# Patient Record
Sex: Female | Born: 1960 | Race: White | Hispanic: No | Smoking: Never smoker
Health system: Southern US, Community
[De-identification: ages and names within clinical notes are randomized; demographics above are authoritative.]

## PROBLEM LIST (undated history)

## (undated) DIAGNOSIS — N6310 Unspecified lump in the right breast, unspecified quadrant: Secondary | ICD-10-CM

## (undated) DIAGNOSIS — D649 Anemia, unspecified: Secondary | ICD-10-CM

## (undated) DIAGNOSIS — Z86718 Personal history of other venous thrombosis and embolism: Secondary | ICD-10-CM

## (undated) DIAGNOSIS — T8859XA Other complications of anesthesia, initial encounter: Secondary | ICD-10-CM

## (undated) DIAGNOSIS — I739 Peripheral vascular disease, unspecified: Secondary | ICD-10-CM

## (undated) DIAGNOSIS — I82409 Acute embolism and thrombosis of unspecified deep veins of unspecified lower extremity: Secondary | ICD-10-CM

## (undated) DIAGNOSIS — T4145XA Adverse effect of unspecified anesthetic, initial encounter: Secondary | ICD-10-CM

## (undated) DIAGNOSIS — Z90711 Acquired absence of uterus with remaining cervical stump: Secondary | ICD-10-CM

## (undated) DIAGNOSIS — IMO0002 Reserved for concepts with insufficient information to code with codable children: Secondary | ICD-10-CM

## (undated) HISTORY — PX: ABDOMINAL HYSTERECTOMY: SHX81

## (undated) HISTORY — PX: WISDOM TOOTH EXTRACTION: SHX21

## (undated) HISTORY — DX: Unspecified lump in the right breast, unspecified quadrant: N63.10

## (undated) HISTORY — PX: OVARIAN CYST REMOVAL: SHX89

## (undated) HISTORY — DX: Acute embolism and thrombosis of unspecified deep veins of unspecified lower extremity: I82.409

## (undated) HISTORY — DX: Acquired absence of uterus with remaining cervical stump: Z90.711

## (undated) HISTORY — DX: Personal history of other venous thrombosis and embolism: Z86.718

## (undated) HISTORY — PX: MYOMECTOMY: SHX85

## (undated) HISTORY — DX: Reserved for concepts with insufficient information to code with codable children: IMO0002

---

## 1998-08-06 ENCOUNTER — Encounter: Admission: RE | Admit: 1998-08-06 | Discharge: 1998-08-13 | Payer: Self-pay | Admitting: Family Medicine

## 1998-11-12 ENCOUNTER — Other Ambulatory Visit: Admission: RE | Admit: 1998-11-12 | Discharge: 1998-11-12 | Payer: Self-pay | Admitting: Obstetrics & Gynecology

## 2001-03-06 ENCOUNTER — Ambulatory Visit (HOSPITAL_COMMUNITY): Admission: RE | Admit: 2001-03-06 | Discharge: 2001-03-06 | Payer: Self-pay | Admitting: Obstetrics and Gynecology

## 2001-03-06 ENCOUNTER — Encounter: Payer: Self-pay | Admitting: Obstetrics and Gynecology

## 2001-03-15 ENCOUNTER — Other Ambulatory Visit: Admission: RE | Admit: 2001-03-15 | Discharge: 2001-03-15 | Payer: Self-pay | Admitting: Obstetrics and Gynecology

## 2001-11-07 ENCOUNTER — Encounter: Payer: Self-pay | Admitting: Obstetrics and Gynecology

## 2001-11-07 ENCOUNTER — Encounter: Admission: RE | Admit: 2001-11-07 | Discharge: 2001-11-07 | Payer: Self-pay | Admitting: Obstetrics and Gynecology

## 2001-11-21 ENCOUNTER — Encounter: Payer: Self-pay | Admitting: Obstetrics and Gynecology

## 2001-11-21 ENCOUNTER — Encounter: Admission: RE | Admit: 2001-11-21 | Discharge: 2001-11-21 | Payer: Self-pay | Admitting: Obstetrics and Gynecology

## 2002-04-12 ENCOUNTER — Ambulatory Visit: Admission: RE | Admit: 2002-04-12 | Discharge: 2002-04-12 | Payer: Self-pay | Admitting: Family Medicine

## 2003-03-07 ENCOUNTER — Encounter: Admission: RE | Admit: 2003-03-07 | Discharge: 2003-03-07 | Payer: Self-pay | Admitting: Family Medicine

## 2003-03-18 ENCOUNTER — Other Ambulatory Visit: Admission: RE | Admit: 2003-03-18 | Discharge: 2003-03-18 | Payer: Self-pay | Admitting: Obstetrics and Gynecology

## 2003-06-13 HISTORY — PX: ENDOMETRIAL BIOPSY: SHX622

## 2005-04-15 ENCOUNTER — Other Ambulatory Visit: Admission: RE | Admit: 2005-04-15 | Discharge: 2005-04-15 | Payer: Self-pay | Admitting: Obstetrics and Gynecology

## 2005-05-02 ENCOUNTER — Encounter: Admission: RE | Admit: 2005-05-02 | Discharge: 2005-05-02 | Payer: Self-pay | Admitting: Obstetrics and Gynecology

## 2005-08-05 ENCOUNTER — Inpatient Hospital Stay (HOSPITAL_COMMUNITY): Admission: RE | Admit: 2005-08-05 | Discharge: 2005-08-07 | Payer: Self-pay | Admitting: Obstetrics and Gynecology

## 2005-08-05 ENCOUNTER — Encounter (INDEPENDENT_AMBULATORY_CARE_PROVIDER_SITE_OTHER): Payer: Self-pay | Admitting: Specialist

## 2005-09-25 ENCOUNTER — Inpatient Hospital Stay (HOSPITAL_COMMUNITY): Admission: AD | Admit: 2005-09-25 | Discharge: 2005-09-26 | Payer: Self-pay | Admitting: Obstetrics and Gynecology

## 2009-08-12 ENCOUNTER — Encounter: Admission: RE | Admit: 2009-08-12 | Discharge: 2009-08-12 | Payer: Self-pay | Admitting: General Surgery

## 2009-09-22 ENCOUNTER — Ambulatory Visit: Payer: Self-pay | Admitting: Vascular Surgery

## 2010-06-15 NOTE — Consult Note (Signed)
NEW PATIENT CONSULTATION   Danielle Griffith, Danielle Griffith  DOB:  Jun 09, 1960                                       09/22/2009  GNFAO#:[id number]   The patient presents today for evaluation of lower extremity discomfort.  She is a very pleasant 50 year old white female with a complex past  history.  She most recently has been evaluated for lower abdominal pain,  more left than  right.  She has had an extensive workup to include  ultrasound and CT scans.  This has shown no evidence of gallstones but  does have poor function of her gallbladder, and is being considered for  cholecystectomy by Dr.  Donell Beers.  On CT she did have marked collateral  formation on her lower abdomen and pelvis and we are seeing her for  further evaluation of this.  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  The patient does  have extensive past medical history regarding her left leg.  She  reported that in 1991 or 1992 she had very severe sudden onset of left  leg swelling.  She did have some delay in the what sounds like  relatively apparent diagnosis of left leg DVT.  She subsequently was  placed on Coumadin therapy for several months.  She has been very  diligent wearing her thigh high compression garments since that time.  She was seen in our office by Dr. Jerilee Field in 2004 where she was  having some changes of hyperpigmentation over the medial portion of the  left calf.  He felt that she had had a recent episode of superficial  thrombophlebitis which was resolving and was again instructed on  compression garments.  She has done fairly well since that time until  the recent episode of lower abdominal pain.   PAST MEDICAL HISTORY:  Significant for elevated cholesterol.  She has  had partial hysterectomy and fibroid removal.  Also had wisdom teeth  removal in the past.   SOCIAL HISTORY:  She is married.  She is a Transport planner.  She  has no children.  She does not smoke or drink alcohol.   She does not have any strong family history of clotting.   REVIEW OF SYSTEMS:  Her weight is 200 pounds.  She is 5 feet 7 inches  tall.  She does have pain in her legs with lying flat.  History of  phlebitis.  CARDIAC:  Negative.  GI:  Positive for hemorrhoids, diarrhea, and constipation.  NEUROLOGIC:  Occasional dizziness.  PULMONARY:  Negative.  HEMATOLOGIC:  For iron deficiency anemia.  URINARY:  Simple cyst on the right kidney.  ENT:  Negative.  MUSCULOSKELETAL:  Negative.  PSYCHIATRIC:  Negative  SKIN:  Negative.   PHYSICAL EXAMINATION:  A well-developed, well-nourished white female  appearing stated age in no acute stress.  Blood pressure is 125/80,  pulse 97, respirations 16.  HEENT:  Normal.  Chest:  Clear bilaterally.  Heart:  Regular rate and rhythm.  Abdomen:  Soft, nontender, moderate  obesity.  She does have prominent veins on the lower abdomen and  prepubic area.  I do not elicit any specific tenderness over this area.  Her radial pulses, femoral and dorsalis pedis pulses are 2+ bilaterally.  Musculoskeletal:  No major deformity or cyanosis.  Neurologic:  No  focal  weakness or paresthesias.  Skin:  No ulcers or rashes.  She does have an  area of approximately 5 x 7-cm of hyperpigmentation in her medial calf  which is similar to that described by Dr. Hart Rochester in 2004.   I reviewed her CT scan and discussed this with the patient.  It appears  that she did have iliofemoral DVT in 1991 and has marked collateral  formation in the subsequent 20 years.  She fortunately does not have any  significant progression of sequelae of venous hypertension in her left  leg.  I explained that this finding on CT was typical collateral  formation around the iliac occlusion from her old DVT.  I explained  there would not be any specific treatment and that certainly I am not  convinced that this is causing any of her left lower  quadrant  discomfort.  She was reassured with this discussion and will see [place]  again on an as-needed basis.  I do not see any contraindication to  cholecystectomy based on her studies.  She would need to have caution if  she had lower abdominal or suprapubic incisions due to the collateral  vein formation.  She will see [place] again on an as-needed basis.     Larina Earthly, M.D.  Electronically Signed   TFE/MEDQ  D:  09/22/2009  T:  09/23/2009  Job:  [id number]   cc:   Almond Lint, MD  Murlean Caller, MD

## 2010-06-18 NOTE — Discharge Summary (Signed)
NAME:  Danielle, Griffith NO.:  [id number]   MEDICAL RECORD NO.:  [id number]          PATIENT TYPE:  INP   LOCATION:  9303                          FACILITY:  [facility]   PHYSICIAN:  Naima A. Dillard, M.D. DATE OF BIRTH:  [birth year]   DATE OF ADMISSION:  08/05/2005  DATE OF DISCHARGE:  08/07/2005                                 DISCHARGE SUMMARY   DISCHARGE DIAGNOSES:  1. Symptomatic uterine fibroids.  2. Pelvic adhesions.   OPERATION ON THE DAY OF ADMISSION:  The patient underwent a supracervical  hysterectomy with lysis of adhesions, tolerating the procedure well.   HISTORY OF PRESENT ILLNESS:  Danielle Griffith is a 50 year old female presenting  for a supracervical hysterectomy because of symptomatic uterine fibroids.  Please see the patient's dictated history and physical examination for  details.   PREOPERATIVE PHYSICAL EXAMINATION:  VITAL SIGNS:  Blood pressure 120/60,  weight is 197 pounds.  GENERAL:  Within normal limits.  PELVIC:  Vulva and vaginal exam within normal limits.  Cervix is nontender  without lesions.  The uterus is about 10 weeks size and irregular.  Adnexa  with no masses.  Rectovaginal reveals no masses.   HOSPITAL COURSE:  On the date of admission, the patient underwent the  aforementioned procedures, tolerating them well.  The postoperative course  was unremarkable with the patient tolerating a postoperative hemoglobin of  11.9 (preoperative hemoglobin 12.6).  By postoperative day #2, the patient  had resumed bowel and bladder function, and was therefore deemed ready for  discharge home.   DISCHARGE MEDICATIONS:  1. Colace 1 tablet twice daily until bowel movements are normal.  2. Phenergan 1 tablet every 6 hours as needed for nausea or vomiting.  3. Lortab elixir 5 to 15 mL every 4 hours as needed for pain.  4. The patient is to begin a baby aspirin daily at home.   FOLLOWUP:  The patient is to followup with Dr. Normand Sloop as previously  scheduled in 6 weeks for her postoperative visit.   DISCHARGE INSTRUCTIONS:  The patient was advised to call the doctor for any  temperature greater than or equal to 100.4 degrees Fahrenheit orally,  increased vaginal bleeding or increased pain not relieved by pain  medication.  She was further advised to avoid driving for 2 weeks, heavy  lifting for 6 weeks, intercourse for 6 weeks, that she may shower, may walk  up steps, may increase her activities slowly.  Her diet is without  restriction.   FINAL PATHOLOGY:  Uterus without cervix:  Secretory endometrium, no  hyperplasia or carcinoma identified.  Leiomyomata, intramural uterine  serosa, focal fibrous adhesions.  No endometriosis or evidence of  malignancy.      [place] J. Adline Peals.      Naima A. Normand Sloop, M.D.  Electronically Signed    EJP/MEDQ  D:  09/07/2005  T:  09/07/2005  Job:  [id number]

## 2010-06-18 NOTE — Op Note (Signed)
NAME:  Danielle Griffith, Danielle Griffith NO.:  [id number]   MEDICAL RECORD NO.:  [id number]          PATIENT TYPE:  INP   LOCATION:  9399                          FACILITY:  [facility]   PHYSICIAN:  Naima A. Dillard, M.D. DATE OF BIRTH:  1961-01-31   DATE OF PROCEDURE:  08/05/2005  DATE OF DISCHARGE:                                 OPERATIVE REPORT   PREOPERATIVE DIAGNOSIS:  Symptomatic fibroids.   POSTOPERATIVE DIAGNOSES:  Symptomatic fibroids with questionable  endometriosis.   PROCEDURE:  Lysis of adhesions, supra-cervical hysterectomy.   SURGEON:  Naima A. Normand Sloop, M.D.   ASSISTANT:  Osborn Coho, M.D.   ANESTHESIA:  General endotracheal tube.   SPECIMENS:  Uterus.   ESTIMATED BLOOD LOSS:  Was 350 mL.   COMPLICATIONS:  None.  The patient to PACU in stable condition.   DESCRIPTION OF PROCEDURE:  The patient was taken to the operating room where  she was given general anesthesia and prepped and draped in the normal  sterile fashion.  A Foley catheter was placed was done with latex-free  properties.  A Pfannenstiel skin incision was made along the prior incision  and carried down to the fascia.  The fascia was incised in the midline and  extended bilaterally.  Kochers were placed in the superior aspect of the  fascia which was dissected off the rectus muscles both sharply and bluntly.  The inferior aspect of the fascia was dissected in a similar fashion.  The  peritoneum was identified and entered sharply and extended superiorly and  inferiorly with good visualization of the bowel and bladder.  The bowel was  packed away with moist laparotomy sponges.  The O'Connor-Sullivan was placed  without difficulty.  There were some bowel adhesions to the uterus which  were sharply dissected.  The bowel was noted to be intact.  There were some  left ovarian bowel adhesions which were taken down.  Again the bowel and  ovary were both noted to be intact.  Both cornu were grasped with a  Heaney  clamp.  Both round ligaments were sutured with #0 Vicryl and cut.  The  squamous peritoneum was cut in the midline from both sides.  The bladder was  both sharply and bluntly dissected away from the cervix with moist  laparotomy sponges.  Both utero-ovarian ligaments were doubly clamped with  Heaney clamps, cut and ligated with a free tie and then suture ligated.  Hemostasis was assured.  The patient's left ovary and tube had a small area  of bleeding which was made hemostatic with cautery and a figure-of-eight  stitch with #2-0 Vicryl.  The patient's uterine arteries were clamped with  Heaney clamps, cut and suture ligated.  Hemostasis was assured.  The  patient's Cardinal ligaments were then clamped, cut and suture ligated.  The  uterus was then amputated.  The cervical stump was repaired with #0 Vicryl.  A second layer of #0 Vicryl was used to imbricate the stump.  Irrigation was  done.  Hemostasis was noted.  A small amount of Gelfoam was placed in that  area.  The peritoneum  was closed with #0 chromic.  The rectus muscle was  inspected and noted to be hemostatic.  Any bleeding areas were made  hemostatic with Bovie cautery.  The fascia was closed with #0 Vicryl in a  running fashion meeting in the midline on both sides.  The subcutaneous  tissue was made hemostatic with Bovie cautery.  A JP drain was placed in the  subcutaneous tissue and #2-0 plain was used to reapproximate the  subcutaneous tissue.  The skin was closed with #3-0 Monocryl in a  subcuticular fashion.  Steri-Strips were applied.  The sponge, lap and  needle counts were correct.   The patient went to the recovery room in stable condition.      Naima A. Normand Sloop, M.D.  Electronically Signed     NAD/MEDQ  D:  08/05/2005  T:  08/05/2005  Job:  [id number]

## 2010-06-18 NOTE — Op Note (Signed)
NAME:  Danielle Griffith, Danielle Griffith NO.:  [id number]   MEDICAL RECORD NO.:  [id number]          PATIENT TYPE:  INP   LOCATION:  NA                            [facility]ILITY:  [facility]   PHYSICIAN:  Naima A. Dillard, M.D. DATE OF BIRTH:  06/15/1960   DATE OF PROCEDURE:  DATE OF DISCHARGE:                                 OPERATIVE REPORT   DIAGNOSIS:  Symptomatic fibroids.   Patient has a long history of fibroids and menorrhagia and presented to me  in May, 2007, stating that she was finally ready to have a hysterectomy for  the fibroids.  She reports that the pain and the pressure from the fibroids  as well as the heavy bleeding has worsened over the years, and despite  having a myomectomy, she still continues to have a lot of abdominal pressure  and recent heavy periods from her fibroids.  Patient denies any  intramenstrual bleeding.  Says that her menses last about 3-5 days, and  patient changes the pad about 1-1/2 to 2 hours and occasionally soaks  through a super pad.  She does report some fatigue.  No chest pain.  No  shortness of breath.  No history of a bleeding disorder.   PAST MEDICAL HISTORY:  Significant for two lumbar herniated disks, history  of a foot fracture, and a left leg DVT secondary to trauma.  She took  Coumadin for a year.  Her workup was negative for thrombophilia.  Currently  is on a baby aspirin each day, which she stopped a week ago.   PAST MEDICAL HISTORY:  Significant for abdominal myomectomy with some teeth.   SOCIAL HISTORY:  Patient denies any alcohol, tobacco, or drug use.   REVIEW OF SYSTEMS:  Hematology is significant for a history of a DVT.  RESPIRATORY:  Unremarkable.  GI:  Unremarkable.  GENITOURINARY:  As above.  MUSCULOSKELETAL:  Significant for DVT.  PSYCHIATRIC:  Unremarkable.   PHYSICAL EXAMINATION:  VITAL SIGNS:  Patient weighs 197 pounds.  Blood  pressure 120/60.  HEENT:  Pupils are equal here and reactive normally.  Throat is clear.  NECK:  Thyroid is not enlarged.  LUNGS:  Clear to auscultation bilaterally.  HEART:  Regular rate and rhythm.  BREASTS:  No masses, discharge, skin changes, or nipple retraction  bilaterally.  BACK:  No CVA tenderness bilaterally.  ABDOMEN:  Nontender without any masses or organomegaly.  EXTREMITIES:  No clubbing, cyanosis or edema.  NEUROLOGIC:  Within normal limits.  PELVIC:  Vulva and vaginal exam is within normal limits.  Cervix ix  nontender without any lesion.  The uterus is about 10 weeks size and  irregular.  Adnexa have no masses.  Rectovaginal reveals no masses.  Allergies:  Latex, Milk products.   PERTINENT LABORATORY DATA:  Patient had an endometrial biopsy in March,  2007, which was normal.  She had a mammogram in April, 2007, which was  normal.   She had a TSH which was normal at 5.27.  In March, 2007, her hemoglobin was  stable at 12.9.   Ultrasound of the uterus was done in  March, 2007.  The uterus measured 10 x  6 x 5.3 with several fibroids.  Her ovaries were noted to be normal.   ASSESSMENT:  Symptomatic fibroids with a history of a left lower leg deep  venous thrombosis.   PLAN:  Subcu heparin before surgery and subcu Lovenox with compression boots  preop, intraop, and postop.  Subcu Lovenox postop once the patient is found  to be hemodynamically stable.  Patient received a bowel prep because of her  history of a myomectomy.  Patient signed the hysterectomy consent form.  She  knows that this is permanent and she will not be able to have children.  She  understands the risks are not limited to bleeding, infection, damage to  internal organs, bowel, bladder, and major blood vessel.  All treatments for  fibroids were reviewed with the patient in detail, and patient has chosen a  super cervical hysterectomy.  She does not want her cervix removed.  She has  never had an abnormal Pap smear.  We discussed the risks of trachelectomy in  the future or an abnormal Pap,  and patient still desires to proceed.      Naima A. Normand Sloop, M.D.  Electronically Signed     NAD/MEDQ  D:  08/04/2005  T:  08/04/2005  Job:  [id number]

## 2011-03-16 ENCOUNTER — Encounter (HOSPITAL_COMMUNITY): Payer: Self-pay | Admitting: Pharmacist

## 2011-08-02 ENCOUNTER — Telehealth: Payer: Self-pay | Admitting: Obstetrics and Gynecology

## 2011-08-02 ENCOUNTER — Other Ambulatory Visit: Payer: Self-pay | Admitting: Obstetrics and Gynecology

## 2011-08-02 NOTE — Telephone Encounter (Signed)
Niccole/ND pt

## 2011-08-02 NOTE — Telephone Encounter (Signed)
Spoke with pt rgd msg pt states time for annual mammogram but having some pain and tenderness in breat and breast center states need order for diagnostic mammo advise pt will consult wit ND and call her back pt voice understanding

## 2011-08-03 ENCOUNTER — Telehealth: Payer: Self-pay

## 2011-08-03 NOTE — Telephone Encounter (Signed)
Pt needs an appointment and exam first.

## 2011-08-03 NOTE — Telephone Encounter (Signed)
Spoke with pt rgd previous call informed per ND need eval first in order to get order for diagnostic mammo pt has appt in august for aex pt will wait until then to be seen offered pt a sooner appt pt declined pt states can wait until AEX

## 2011-09-16 ENCOUNTER — Encounter: Payer: Self-pay | Admitting: Obstetrics and Gynecology

## 2011-09-16 ENCOUNTER — Ambulatory Visit (INDEPENDENT_AMBULATORY_CARE_PROVIDER_SITE_OTHER): Payer: BC Managed Care – PPO | Admitting: Obstetrics and Gynecology

## 2011-09-16 VITALS — BP 116/72 | HR 74 | Ht 67.0 in | Wt 207.0 lb

## 2011-09-16 DIAGNOSIS — N644 Mastodynia: Secondary | ICD-10-CM

## 2011-09-16 NOTE — Progress Notes (Signed)
Last Pap: 06/12/09, next pap due 2014 per last visit note WNL: Yes Regular Periods:no Contraception: partial hysterectomy  Monthly Breast exam:no Tetanus<80yrs:yes Nl.Bladder Function:yes Daily BMs:yes Healthy Diet:no Calcium:yes Mammogram:yes Date of Mammogram: 05/07/08, Pt has not had mammogram due to breast tenderness and was told she had to be evaluated for orders for a different type of mammogram.  Exercise:no Have often Exercise: n/a Seatbelt: yes Abuse at home: no Stressful work:yes Sigmoid-colonoscopy: n/a Bone Density: No PCP: Dr. Elby Showers at [facility] Change in PMH: none Change in ZOX:WRUE BP 116/72  Pulse 74  Ht 5\' 7"  (1.702 m)  Wt 207 lb (93.895 kg)  BMI 32.42 kg/m2 Pt without complaints Physical Examination: General appearance - alert, well appearing, and in no distress Mental status - normal mood, behavior, speech, dress, motor activity, and thought processes Neck - supple, no significant adenopathy, thyroid exam: thyroid is normal in size without nodules or tenderness Chest - clear to auscultation, no wheezes, rales or rhonchi, symmetric air entry Heart - normal rate and regular rhythm Abdomen - soft, nontender, nondistended, no masses or organomegaly Breasts - breasts appear normal, no suspicious masses, no skin or nipple changes or axillary nodes Pelvic - normal external genitalia, vulva, vagina, cervix, uterus and adnexa Rectal - normal rectal, no masses, rectal exam not indicated Back exam - full range of motion, no tenderness, palpable spasm or pain on motion Neurological - alert, oriented, normal speech, no focal findings or movement disorder noted Musculoskeletal - no joint tenderness, deformity or swelling Extremities - no edema, redness or tenderness in the calves or thighs Skin - normal coloration and turgor, no rashes, no suspicious skin lesions noted Routine exam Pap sent no due next yr Mammogram due yes pt with breast tenderness.   schedule diagnostic mammogram RT 1 yr

## 2011-09-16 NOTE — Patient Instructions (Signed)

## 2011-11-03 ENCOUNTER — Other Ambulatory Visit: Payer: Self-pay | Admitting: Obstetrics and Gynecology

## 2011-11-03 ENCOUNTER — Ambulatory Visit
Admission: RE | Admit: 2011-11-03 | Discharge: 2011-11-03 | Disposition: A | Discharge status: Home / Self Care | Payer: BC Managed Care – PPO | Source: Ambulatory Visit | Attending: Obstetrics and Gynecology | Admitting: Obstetrics and Gynecology

## 2011-11-03 DIAGNOSIS — N644 Mastodynia: Secondary | ICD-10-CM

## 2011-11-07 ENCOUNTER — Encounter: Payer: Self-pay | Admitting: Obstetrics and Gynecology

## 2012-05-10 ENCOUNTER — Other Ambulatory Visit: Payer: Self-pay | Admitting: Gastroenterology

## 2012-07-11 ENCOUNTER — Encounter (HOSPITAL_COMMUNITY): Payer: Self-pay | Admitting: *Deleted

## 2012-07-16 ENCOUNTER — Encounter (HOSPITAL_COMMUNITY): Payer: Self-pay | Admitting: Pharmacy Technician

## 2012-07-24 ENCOUNTER — Ambulatory Visit (HOSPITAL_COMMUNITY)
Admission: RE | Admit: 2012-07-24 | Payer: BC Managed Care – PPO | Source: Ambulatory Visit | Admitting: Gastroenterology

## 2012-07-24 HISTORY — DX: Adverse effect of unspecified anesthetic, initial encounter: T41.45XA

## 2012-07-24 HISTORY — DX: Anemia, unspecified: D64.9

## 2012-07-24 HISTORY — DX: Other complications of anesthesia, initial encounter: T88.59XA

## 2012-07-24 HISTORY — DX: Peripheral vascular disease, unspecified: I73.9

## 2012-07-24 SURGERY — COLONOSCOPY WITH PROPOFOL
Anesthesia: Monitor Anesthesia Care

## 2012-10-04 ENCOUNTER — Other Ambulatory Visit: Payer: Self-pay

## 2012-10-04 DIAGNOSIS — Z1231 Encounter for screening mammogram for malignant neoplasm of breast: Secondary | ICD-10-CM

## 2012-11-08 ENCOUNTER — Ambulatory Visit
Admission: RE | Admit: 2012-11-08 | Discharge: 2012-11-08 | Disposition: A | Discharge status: Home / Self Care | Payer: BC Managed Care – PPO | Source: Ambulatory Visit

## 2012-11-08 DIAGNOSIS — Z1231 Encounter for screening mammogram for malignant neoplasm of breast: Secondary | ICD-10-CM

## 2014-01-27 ENCOUNTER — Other Ambulatory Visit: Payer: Self-pay

## 2014-01-27 DIAGNOSIS — Z1231 Encounter for screening mammogram for malignant neoplasm of breast: Secondary | ICD-10-CM

## 2014-02-06 ENCOUNTER — Ambulatory Visit
Admission: RE | Admit: 2014-02-06 | Discharge: 2014-02-06 | Disposition: A | Discharge status: Home / Self Care | Payer: BC Managed Care – PPO | Source: Ambulatory Visit

## 2014-02-06 DIAGNOSIS — Z1231 Encounter for screening mammogram for malignant neoplasm of breast: Secondary | ICD-10-CM

## 2014-02-10 ENCOUNTER — Other Ambulatory Visit: Payer: Self-pay | Admitting: Family Medicine

## 2014-02-10 DIAGNOSIS — R928 Other abnormal and inconclusive findings on diagnostic imaging of breast: Secondary | ICD-10-CM

## 2014-02-24 ENCOUNTER — Ambulatory Visit
Admission: RE | Admit: 2014-02-24 | Discharge: 2014-02-24 | Disposition: A | Discharge status: Home / Self Care | Payer: BC Managed Care – PPO | Source: Ambulatory Visit | Attending: Family Medicine | Admitting: Family Medicine

## 2014-02-24 ENCOUNTER — Other Ambulatory Visit: Payer: Self-pay | Admitting: Family Medicine

## 2014-02-24 DIAGNOSIS — R928 Other abnormal and inconclusive findings on diagnostic imaging of breast: Secondary | ICD-10-CM

## 2015-02-25 ENCOUNTER — Other Ambulatory Visit: Payer: Self-pay

## 2015-02-25 DIAGNOSIS — Z1231 Encounter for screening mammogram for malignant neoplasm of breast: Secondary | ICD-10-CM

## 2015-03-16 ENCOUNTER — Ambulatory Visit
Admission: RE | Admit: 2015-03-16 | Discharge: 2015-03-16 | Disposition: A | Discharge status: Home / Self Care | Payer: BC Managed Care – PPO | Source: Ambulatory Visit

## 2015-03-16 DIAGNOSIS — Z1231 Encounter for screening mammogram for malignant neoplasm of breast: Secondary | ICD-10-CM

## 2016-03-17 ENCOUNTER — Other Ambulatory Visit: Payer: Self-pay | Admitting: Family Medicine

## 2016-03-17 DIAGNOSIS — Z1231 Encounter for screening mammogram for malignant neoplasm of breast: Secondary | ICD-10-CM

## 2016-03-29 ENCOUNTER — Ambulatory Visit: Payer: BC Managed Care – PPO

## 2016-04-13 ENCOUNTER — Ambulatory Visit
Admission: RE | Admit: 2016-04-13 | Discharge: 2016-04-13 | Disposition: A | Discharge status: Home / Self Care | Payer: BC Managed Care – PPO | Source: Ambulatory Visit | Attending: Family Medicine | Admitting: Family Medicine

## 2016-04-13 DIAGNOSIS — Z1231 Encounter for screening mammogram for malignant neoplasm of breast: Secondary | ICD-10-CM

## 2016-11-08 ENCOUNTER — Ambulatory Visit (INDEPENDENT_AMBULATORY_CARE_PROVIDER_SITE_OTHER): Payer: BC Managed Care – PPO | Admitting: Podiatry

## 2016-11-08 ENCOUNTER — Encounter: Payer: Self-pay | Admitting: Podiatry

## 2016-11-08 ENCOUNTER — Ambulatory Visit (INDEPENDENT_AMBULATORY_CARE_PROVIDER_SITE_OTHER): Payer: BC Managed Care – PPO

## 2016-11-08 VITALS — BP 98/52 | HR 73 | Resp 16

## 2016-11-08 DIAGNOSIS — M778 Other enthesopathies, not elsewhere classified: Secondary | ICD-10-CM

## 2016-11-08 DIAGNOSIS — M7752 Other enthesopathy of left foot: Secondary | ICD-10-CM

## 2016-11-08 DIAGNOSIS — M779 Enthesopathy, unspecified: Principal | ICD-10-CM

## 2016-11-08 DIAGNOSIS — N951 Menopausal and female climacteric states: Secondary | ICD-10-CM | POA: Insufficient documentation

## 2016-11-08 DIAGNOSIS — I82409 Acute embolism and thrombosis of unspecified deep veins of unspecified lower extremity: Secondary | ICD-10-CM | POA: Insufficient documentation

## 2016-11-08 DIAGNOSIS — N83209 Unspecified ovarian cyst, unspecified side: Secondary | ICD-10-CM | POA: Insufficient documentation

## 2016-11-08 DIAGNOSIS — D259 Leiomyoma of uterus, unspecified: Secondary | ICD-10-CM | POA: Insufficient documentation

## 2016-11-08 MED ORDER — METHYLPREDNISOLONE 4 MG PO TBPK: ORAL_TABLET | ORAL | 0 refills | Status: DC

## 2016-11-08 MED ORDER — MELOXICAM 15 MG PO TABS: 15.0000 mg | ORAL_TABLET | Freq: Every day | ORAL | 3 refills | Status: AC
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
## 2016-12-15 ENCOUNTER — Ambulatory Visit: Payer: BC Managed Care – PPO | Admitting: Podiatry

## 2016-12-15 ENCOUNTER — Encounter: Payer: Self-pay | Admitting: Podiatry

## 2016-12-15 DIAGNOSIS — M7752 Other enthesopathy of left foot: Secondary | ICD-10-CM | POA: Diagnosis not present

## 2016-12-15 DIAGNOSIS — M778 Other enthesopathies, not elsewhere classified: Secondary | ICD-10-CM

## 2016-12-15 DIAGNOSIS — M779 Enthesopathy, unspecified: Principal | ICD-10-CM

## 2016-12-15 NOTE — Progress Notes (Signed)
She presents today for follow-up of her capsulitis second metatarsophalangeal joint of the left foot. She states is still a bit.  Objective: Vital signs are stable she is alert and oriented 3 pulses are palpable. Has tenderness on palpation with direct palpation of the plantar plate second metatarsal.  Assessment: Plantar plate capsulitis.  Plan: I injected the area from the plantar aspect today with dexamethasone and local anesthetic she will notify me with questions or concerns for follow-up.

## 2017-03-24 ENCOUNTER — Other Ambulatory Visit: Payer: Self-pay | Admitting: Family Medicine

## 2017-03-24 DIAGNOSIS — Z139 Encounter for screening, unspecified: Secondary | ICD-10-CM

## 2017-04-17 ENCOUNTER — Ambulatory Visit
Admission: RE | Admit: 2017-04-17 | Discharge: 2017-04-17 | Disposition: A | Discharge status: Home / Self Care | Payer: BC Managed Care – PPO | Source: Ambulatory Visit | Attending: Family Medicine | Admitting: Family Medicine

## 2017-04-17 DIAGNOSIS — Z139 Encounter for screening, unspecified: Secondary | ICD-10-CM

## 2017-12-05 ENCOUNTER — Other Ambulatory Visit: Payer: Self-pay | Admitting: Podiatry

## 2018-02-22 ENCOUNTER — Other Ambulatory Visit: Payer: Self-pay | Admitting: Family Medicine

## 2018-02-22 DIAGNOSIS — Z1231 Encounter for screening mammogram for malignant neoplasm of breast: Secondary | ICD-10-CM

## 2018-04-23 ENCOUNTER — Ambulatory Visit: Payer: BC Managed Care – PPO

## 2018-05-23 ENCOUNTER — Ambulatory Visit: Payer: BC Managed Care – PPO

## 2018-07-11 ENCOUNTER — Other Ambulatory Visit: Payer: Self-pay

## 2018-07-11 ENCOUNTER — Ambulatory Visit
Admission: RE | Admit: 2018-07-11 | Discharge: 2018-07-11 | Disposition: A | Discharge status: Home / Self Care | Payer: BC Managed Care – PPO | Source: Ambulatory Visit | Attending: Family Medicine | Admitting: Family Medicine

## 2018-07-11 DIAGNOSIS — Z1231 Encounter for screening mammogram for malignant neoplasm of breast: Secondary | ICD-10-CM

## 2019-03-30 ENCOUNTER — Other Ambulatory Visit: Payer: Self-pay

## 2019-03-30 ENCOUNTER — Ambulatory Visit: Payer: BC Managed Care – PPO | Attending: Internal Medicine

## 2019-03-30 DIAGNOSIS — Z23 Encounter for immunization: Secondary | ICD-10-CM | POA: Insufficient documentation

## 2019-03-30 NOTE — Progress Notes (Addendum)
   Covid-19 Vaccination Clinic  Name:  Danielle Griffith    MRN: [id number] DOB: Aug 23, 1960  03/30/2019  [name] was observed post Covid-19 immunization for 30 minutes without incidence. She was provided with Vaccine Information Sheet and instruction to access the V-Safe system.   [name] was instructed to call 911 with any severe reactions post vaccine: Mar[place] Kitchen Difficulty breathing  . Swelling of your face and throat  . A fast heartbeat  . A bad rash all over your body  . Dizziness and weakness    Immunizations Administered    Name Date Dose VIS Date Route   Pfizer COVID-19 Vaccine 03/30/2019  1:50 PM 0.3 mL 01/11/2019 Intranasal   Manufacturer: Kenefic   Lot: [id number]   [place]: [id number]

## 2019-04-20 ENCOUNTER — Ambulatory Visit: Payer: BC Managed Care – PPO

## 2019-04-24 ENCOUNTER — Ambulatory Visit: Payer: BC Managed Care – PPO

## 2019-04-24 ENCOUNTER — Ambulatory Visit: Payer: BC Managed Care – PPO | Attending: Internal Medicine

## 2019-04-24 DIAGNOSIS — Z23 Encounter for immunization: Secondary | ICD-10-CM

## 2019-04-24 NOTE — Progress Notes (Signed)
   Covid-19 Vaccination Clinic  Name:  Danielle Griffith    MRN: [id number] DOB: [birth year]  04/24/2019  [name] was observed post Covid-19 immunization for 30 minutes based on pre-vaccination screening without incident. She was provided with Vaccine Information Sheet and instruction to access the V-Safe system.   [name] was instructed to call 911 with any severe reactions post vaccine: Marland Kitchen Difficulty breathing  . Swelling of face and throat  . A fast heartbeat  . A bad rash all over body  . Dizziness and weakness   Immunizations Administered    Name Date Dose VIS Date Route   Pfizer COVID-19 Vaccine 04/24/2019  2:13 PM 0.3 mL 01/11/2019 Intramuscular   Manufacturer: [place]   Lot: [id number]   [place]: [id number]

## 2019-06-26 ENCOUNTER — Other Ambulatory Visit: Payer: Self-pay | Admitting: Family Medicine

## 2019-06-26 DIAGNOSIS — Z1231 Encounter for screening mammogram for malignant neoplasm of breast: Secondary | ICD-10-CM

## 2019-07-12 ENCOUNTER — Other Ambulatory Visit: Payer: Self-pay

## 2019-07-12 ENCOUNTER — Ambulatory Visit
Admission: RE | Admit: 2019-07-12 | Discharge: 2019-07-12 | Disposition: A | Discharge status: Home / Self Care | Payer: BC Managed Care – PPO | Source: Ambulatory Visit | Attending: Family Medicine | Admitting: Family Medicine

## 2019-07-12 DIAGNOSIS — Z1231 Encounter for screening mammogram for malignant neoplasm of breast: Secondary | ICD-10-CM

## 2020-06-16 ENCOUNTER — Other Ambulatory Visit: Payer: Self-pay | Admitting: Family Medicine

## 2020-06-16 DIAGNOSIS — Z1231 Encounter for screening mammogram for malignant neoplasm of breast: Secondary | ICD-10-CM

## 2020-08-12 ENCOUNTER — Ambulatory Visit
Admission: RE | Admit: 2020-08-12 | Discharge: 2020-08-12 | Disposition: A | Discharge status: Home / Self Care | Payer: BC Managed Care – PPO | Source: Ambulatory Visit | Attending: Family Medicine | Admitting: Family Medicine

## 2020-08-12 ENCOUNTER — Other Ambulatory Visit: Payer: Self-pay

## 2020-08-12 DIAGNOSIS — Z1231 Encounter for screening mammogram for malignant neoplasm of breast: Secondary | ICD-10-CM

## 2021-02-24 ENCOUNTER — Other Ambulatory Visit: Payer: Self-pay

## 2021-02-24 ENCOUNTER — Ambulatory Visit (INDEPENDENT_AMBULATORY_CARE_PROVIDER_SITE_OTHER): Payer: BC Managed Care – PPO | Admitting: Psychiatry

## 2021-02-24 DIAGNOSIS — F411 Generalized anxiety disorder: Secondary | ICD-10-CM | POA: Diagnosis not present
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
## 2021-03-11 ENCOUNTER — Other Ambulatory Visit: Payer: Self-pay

## 2021-03-11 ENCOUNTER — Ambulatory Visit (INDEPENDENT_AMBULATORY_CARE_PROVIDER_SITE_OTHER): Payer: BC Managed Care – PPO | Admitting: Psychiatry

## 2021-03-11 DIAGNOSIS — F411 Generalized anxiety disorder: Secondary | ICD-10-CM

## 2021-03-11 NOTE — Progress Notes (Signed)
[facility] Counselor/Therapist Progress Note  Patient ID: SIMREN POPSON, MRN: [id number],    Date: 03/11/2021  Time Spent: 50 minutes   Treatment Type: Individual Therapy  Reported Symptoms: anxiety (reported as main symptom) , PTSD symptoms, depression, hypervigilance, frustration, anger.   Mental Status Exam:  Appearance:   Casual     Behavior:  Appropriate, Sharing, and Motivated  Motor:  Normal  Speech/Language:   Clear and Coherent  Affect:  Depressed and anxious  Mood:  anxious and depressed  Thought process:  goal directed  Thought content:    overthinking  Sensory/Perceptual disturbances:    WNL  Orientation:  oriented to person, place, time/date, situation, day of week, month of year, year, and stated date of Feb. 9, 2023  Attention:  Good  Concentration:  Good  Memory:  WNL  Fund of knowledge:   Good  Insight:    Good and Fair  Judgment:   Good  Impulse Control:  Good   Risk Assessment: Danger to Self:  No Self-injurious Behavior: No Danger to Others: No Duty to Warn:no Physical Aggression / Violence:No  Access to Firearms a concern: No  Gang Involvement:No   Subjective:  Patient in today reporting her symptoms as being "anxiety, anger, frustration, PTSD symptoms, hypervigilance, and some depression. Seems to have some resentment but patient denies this. Focused today more on her anxiety, PTSD, depression, unresolved grief, and anger re: interpersonal issues with coworkers.  Very hard for her to focus on the present without reverting back to the past traumas experienced.  Wants eventually to work in different school or building, "as this is the building I was in when my husband died."  Is having increased difficulties with negative memories, intrusive thoughts, and reliving some situations over again repeatedly.  This was shared in much more detail today as our first session together was primarily an initial evaluation but was not as disclosing with  information.  She discussed prior treatment at a different location where therapist used EMDR which was beneficial to her and we discussed her need for more EMDR, as I am a new therapist for her and am not EMDR trained.  Patient agreed that that is probably what she needed, and I was not aware of that when she was first taken on as a new patient a couple weeks ago.  Worked to put some closure on our time together which involved 2 sessions, and it does seem like the more beneficial thing for patient is to return to get some EMDR treatment to really help her get stabilized and be able to maintain her job and move forward.  Our practice here does not have an EMDR therapist who is able to take a new patient in the near future.  Patient is following up at another practice where they have therapist that are trained in EMDR, and she also stated she may reach back out to her previous EMDR therapist.  Denies any thoughts of harming self or others and is currently maintaining her job but realizes based on symptoms she does need some additional EMDR and will be following up on this.  She knows she can contact this office if needed before getting set up with an EMDR therapist.  Interventions: Cognitive Behavioral Therapy and Insight-Oriented   Treatment Plan Goals: (Patient not signing treatment goal plan on computer screen due to COVID.)  Treatment goals remain on patient's treatment plan as patient works with strategies to achieve her goals.  Progress  is noted each session in the "subject" and/or "plan" sections of treatment note. Long term goal: Reduce overall level, frequency, and intensity of the anxiety so that daily functioning is not impaired. Short term goal: Describe current and past experiences with specific fears, prominent worries, and anxiety/depressive symptoms including their impact on functioning and attempts to resolve it. Strategies: Increase her self confidence and coping with irrational fears and  be able to discern clearly what is rational vs irrational fear.  Diagnosis:   ICD-10-CM   1. Generalized anxiety disorder  F41.1      Plan:  Patient needing to follow back up in therapy with EMDR trained therapist.  She is going to check with a prior therapist she saw that is EMDR trained and also at [facility].  Encouraged patient in this follow-up and she knows she can call if needed before she gets set up with next therapist in order to do some additional EMDR work and get more symptom relief.   This record has been created using [employer].  Chart creation errors have been sought, but may not always have been located and corrected.  Such creation errors do not reflect on the standard of medical care provided.   Shanon Ace, LCSW

## 2021-03-25 ENCOUNTER — Ambulatory Visit: Payer: BC Managed Care – PPO | Admitting: Psychiatry

## 2021-04-05 ENCOUNTER — Ambulatory Visit: Payer: BC Managed Care – PPO | Admitting: Mental Health

## 2021-04-14 ENCOUNTER — Ambulatory Visit: Payer: BC Managed Care – PPO | Admitting: Psychiatry

## 2021-05-07 ENCOUNTER — Other Ambulatory Visit: Payer: Self-pay | Admitting: Cardiovascular Disease

## 2021-05-07 ENCOUNTER — Other Ambulatory Visit (HOSPITAL_COMMUNITY): Payer: Self-pay | Admitting: Cardiovascular Disease

## 2021-05-07 DIAGNOSIS — E785 Hyperlipidemia, unspecified: Secondary | ICD-10-CM

## 2021-05-20 ENCOUNTER — Ambulatory Visit (HOSPITAL_COMMUNITY)
Admission: RE | Admit: 2021-05-20 | Discharge: 2021-05-20 | Disposition: A | Discharge status: Home / Self Care | Payer: Self-pay | Source: Ambulatory Visit | Attending: Cardiology | Admitting: Cardiology

## 2021-05-20 DIAGNOSIS — E785 Hyperlipidemia, unspecified: Secondary | ICD-10-CM | POA: Insufficient documentation

## 2021-07-26 IMAGING — MG MM DIGITAL SCREENING BILAT W/ TOMO AND CAD
8 series · 8 of 24 positions shown · non-contrast
Comparison: Previous exam(s).

CLINICAL DATA: Screening.

EXAM:
DIGITAL SCREENING BILATERAL MAMMOGRAM WITH TOMOSYNTHESIS AND CAD
TECHNIQUE: Bilateral screening digital craniocaudal and mediolateral oblique
mammograms were obtained. Bilateral screening digital breast
tomosynthesis was performed. The images were evaluated with
computer-aided detection.

[R MLO synth-2D]
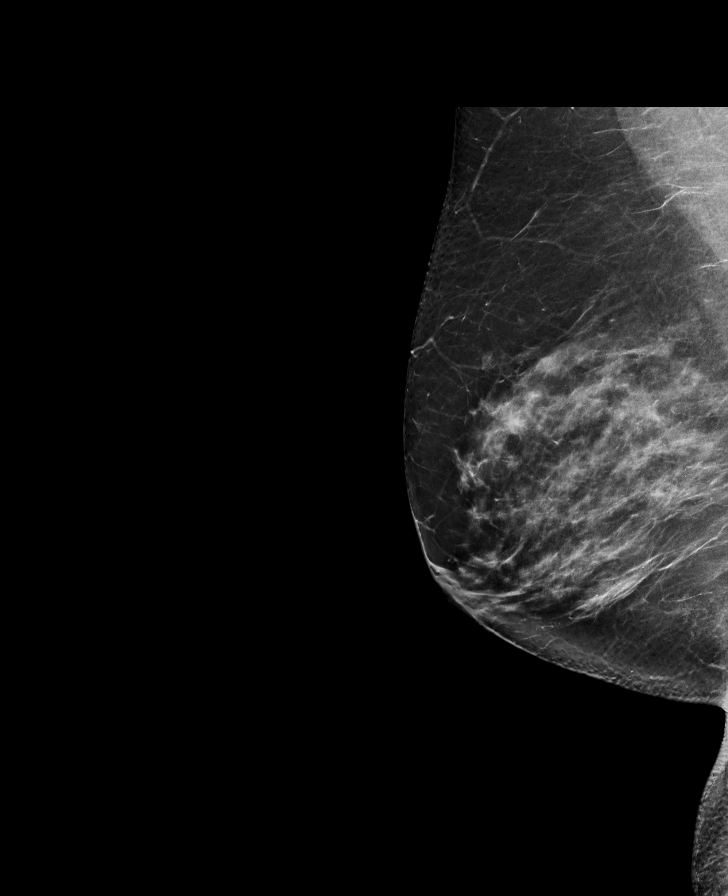

[L CC synth-2D]
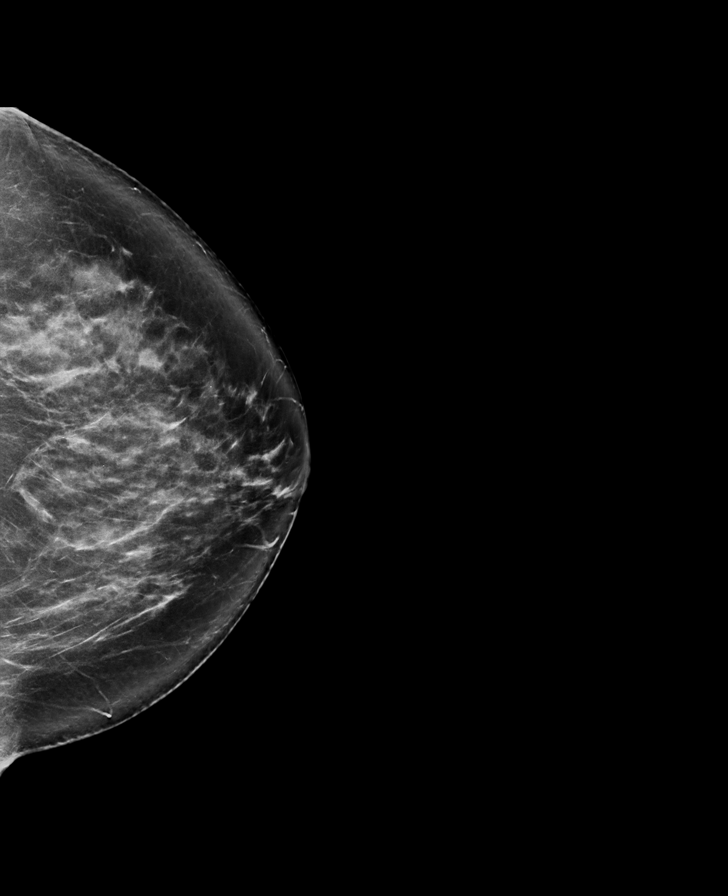

[L MLO synth-2D]
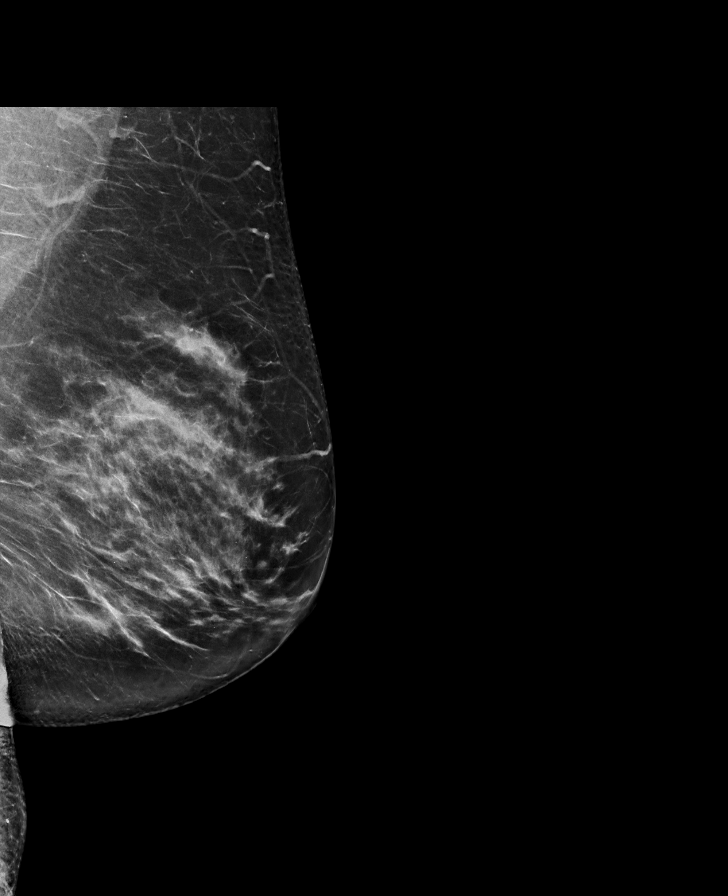

[R CC synth-2D]
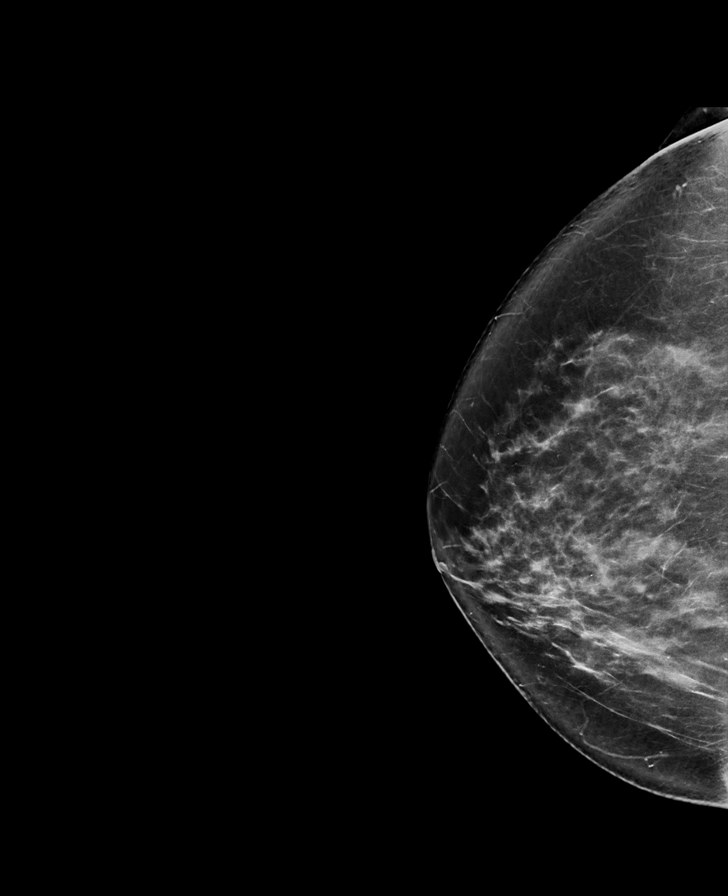

[R MLO tomo · tomo slice 43/86.0]
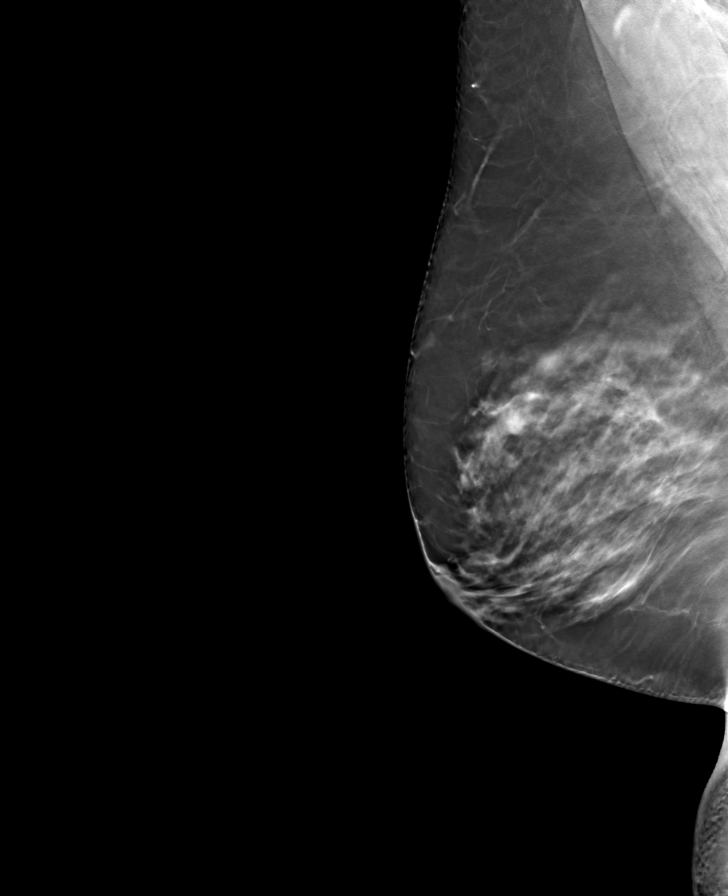

[L MLO tomo · tomo slice 43/85.0]
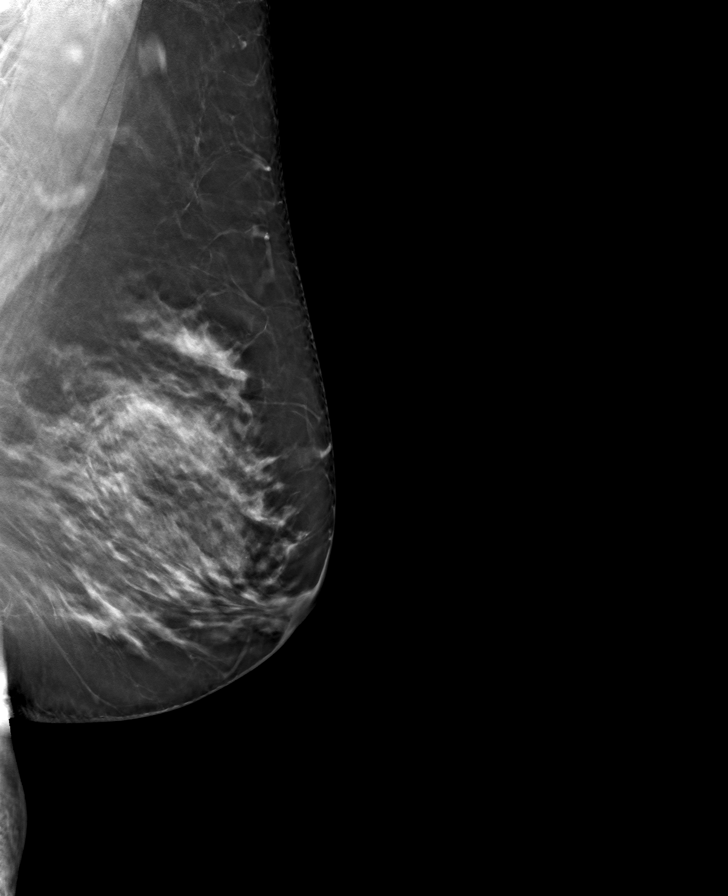

[L CC tomo · tomo slice 44/87.0]
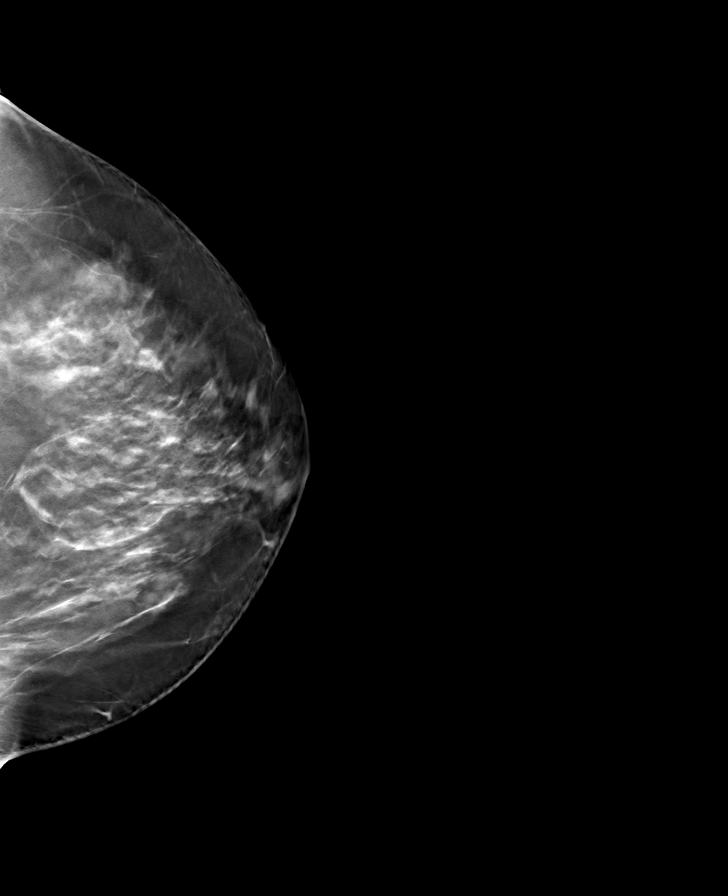

[R CC tomo · tomo slice 43/84.0]
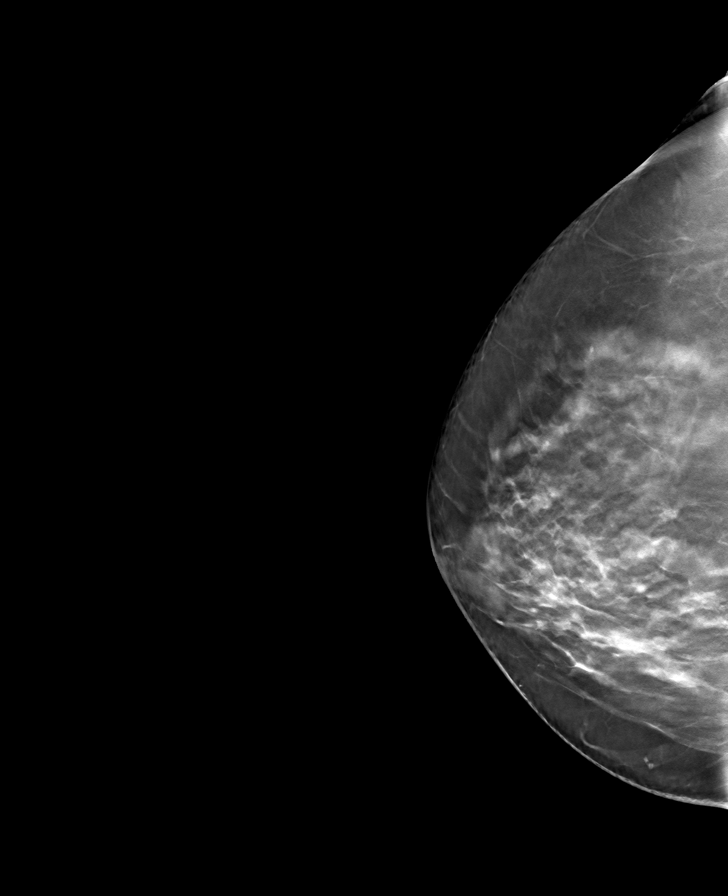

[8 of 24 positions shown; findings below may reference images not displayed]

ACR Breast Density Category c: The breast tissue is heterogeneously
dense, which may obscure small masses.
FINDINGS: There are no findings suspicious for malignancy.
IMPRESSION: No mammographic evidence of malignancy. A result letter of this
screening mammogram will be mailed directly to the patient.

RECOMMENDATION:
Screening mammogram in one year. (Code:[id number]-W-BC3)

BI-RADS CATEGORY  1: Negative.

## 2021-10-14 ENCOUNTER — Other Ambulatory Visit: Payer: Self-pay | Admitting: Family Medicine

## 2021-10-14 DIAGNOSIS — Z1231 Encounter for screening mammogram for malignant neoplasm of breast: Secondary | ICD-10-CM

## 2021-11-04 ENCOUNTER — Ambulatory Visit: Payer: BC Managed Care – PPO

## 2021-12-02 ENCOUNTER — Ambulatory Visit: Payer: BC Managed Care – PPO

## 2021-12-17 ENCOUNTER — Ambulatory Visit
Admission: RE | Admit: 2021-12-17 | Discharge: 2021-12-17 | Disposition: A | Discharge status: Home / Self Care | Payer: BC Managed Care – PPO | Source: Ambulatory Visit | Attending: Family Medicine | Admitting: Family Medicine

## 2021-12-17 DIAGNOSIS — Z1231 Encounter for screening mammogram for malignant neoplasm of breast: Secondary | ICD-10-CM

## 2022-11-07 ENCOUNTER — Emergency Department (HOSPITAL_BASED_OUTPATIENT_CLINIC_OR_DEPARTMENT_OTHER): Payer: BC Managed Care – PPO | Admitting: Radiology

## 2022-11-07 ENCOUNTER — Other Ambulatory Visit: Payer: Self-pay

## 2022-11-07 ENCOUNTER — Encounter (HOSPITAL_BASED_OUTPATIENT_CLINIC_OR_DEPARTMENT_OTHER): Payer: Self-pay

## 2022-11-07 ENCOUNTER — Emergency Department (HOSPITAL_BASED_OUTPATIENT_CLINIC_OR_DEPARTMENT_OTHER)
Admission: EM | Admit: 2022-11-07 | Discharge: 2022-11-07 | Discharge status: Against Medical Advice | Payer: BC Managed Care – PPO | Attending: Emergency Medicine | Admitting: Emergency Medicine

## 2022-11-07 DIAGNOSIS — R0781 Pleurodynia: Secondary | ICD-10-CM | POA: Diagnosis present

## 2022-11-07 DIAGNOSIS — Z5321 Procedure and treatment not carried out due to patient leaving prior to being seen by health care provider: Secondary | ICD-10-CM | POA: Insufficient documentation

## 2022-11-07 NOTE — ED Notes (Signed)
Per registration, pt left.  

## 2022-11-07 NOTE — ED Triage Notes (Signed)
Pt states she feel a "pop" on rt. Side of rib area while pulling weeds Saturday

## 2023-02-07 ENCOUNTER — Other Ambulatory Visit: Payer: Self-pay | Admitting: Family Medicine

## 2023-02-07 DIAGNOSIS — Z1231 Encounter for screening mammogram for malignant neoplasm of breast: Secondary | ICD-10-CM

## 2023-02-23 ENCOUNTER — Ambulatory Visit
Admission: RE | Admit: 2023-02-23 | Discharge: 2023-02-23 | Disposition: A | Discharge status: Home / Self Care | Payer: 59 | Source: Ambulatory Visit | Attending: Family Medicine | Admitting: Family Medicine

## 2023-02-23 DIAGNOSIS — Z1231 Encounter for screening mammogram for malignant neoplasm of breast: Secondary | ICD-10-CM

## 2024-02-06 ENCOUNTER — Other Ambulatory Visit: Payer: Self-pay | Admitting: Family Medicine

## 2024-02-06 DIAGNOSIS — Z1231 Encounter for screening mammogram for malignant neoplasm of breast: Secondary | ICD-10-CM

## 2024-02-26 ENCOUNTER — Ambulatory Visit

## 2024-03-18 ENCOUNTER — Ambulatory Visit
